# Patient Record
Sex: Female | Born: 1985 | Race: White | Hispanic: Yes | Smoking: Never smoker
Health system: Southern US, Community
[De-identification: ages and names within clinical notes are randomized; demographics above are authoritative.]

## PROBLEM LIST (undated history)

## (undated) DIAGNOSIS — J45909 Unspecified asthma, uncomplicated: Secondary | ICD-10-CM

## (undated) HISTORY — DX: Unspecified asthma, uncomplicated: J45.909

---

## 2008-06-19 ENCOUNTER — Ambulatory Visit: Payer: Self-pay | Admitting: Family Medicine

## 2008-06-19 ENCOUNTER — Inpatient Hospital Stay: Payer: Self-pay | Admitting: Obstetrics and Gynecology

## 2011-12-05 ENCOUNTER — Emergency Department: Payer: Self-pay | Admitting: *Deleted

## 2011-12-05 LAB — CBC
HCT: 41.5 % (ref 35.0–47.0)
HGB: 15 g/dL (ref 12.0–16.0)
MCHC: 36.2 g/dL — ABNORMAL HIGH (ref 32.0–36.0)
MCV: 87 fL (ref 80–100)
Platelet: 209 10*3/uL (ref 150–440)
RBC: 4.8 10*6/uL (ref 3.80–5.20)
WBC: 8.8 10*3/uL (ref 3.6–11.0)

## 2011-12-05 LAB — URINALYSIS, COMPLETE
Bacteria: NONE SEEN
Bilirubin,UR: NEGATIVE
Glucose,UR: NEGATIVE mg/dL (ref 0–75)
Nitrite: NEGATIVE
Specific Gravity: 1.023 (ref 1.003–1.030)
Squamous Epithelial: 3

## 2011-12-05 LAB — HCG, QUANTITATIVE, PREGNANCY: Beta Hcg, Quant.: 5573 m[IU]/mL — ABNORMAL HIGH

## 2011-12-07 ENCOUNTER — Emergency Department: Payer: Self-pay | Admitting: Emergency Medicine

## 2011-12-07 LAB — CBC
HCT: 40.5 % (ref 35.0–47.0)
HGB: 14.7 g/dL (ref 12.0–16.0)
MCH: 31.4 pg (ref 26.0–34.0)
MCV: 86 fL (ref 80–100)
Platelet: 202 10*3/uL (ref 150–440)
RBC: 4.69 10*6/uL (ref 3.80–5.20)
WBC: 9.6 10*3/uL (ref 3.6–11.0)

## 2011-12-07 LAB — HCG, QUANTITATIVE, PREGNANCY: Beta Hcg, Quant.: 4409 m[IU]/mL — ABNORMAL HIGH

## 2012-10-06 ENCOUNTER — Ambulatory Visit: Payer: Self-pay | Admitting: Family Medicine

## 2013-04-02 DIAGNOSIS — Z98891 History of uterine scar from previous surgery: Secondary | ICD-10-CM | POA: Insufficient documentation

## 2013-05-15 LAB — HM PAP SMEAR: HM Pap smear: NORMAL

## 2013-10-25 IMAGING — US US OB < 14 WEEKS - US OB TV
1 series · 14 of 28 positions shown · non-contrast
Comparison: none

REASON FOR EXAM: painless vaginal bleeding
7wks
COMMENTS:

[Series 1: us ob < 14 weeks - us ob tv · 0.23mm/px · 14 of 81 slices shown]
[im 3/81]
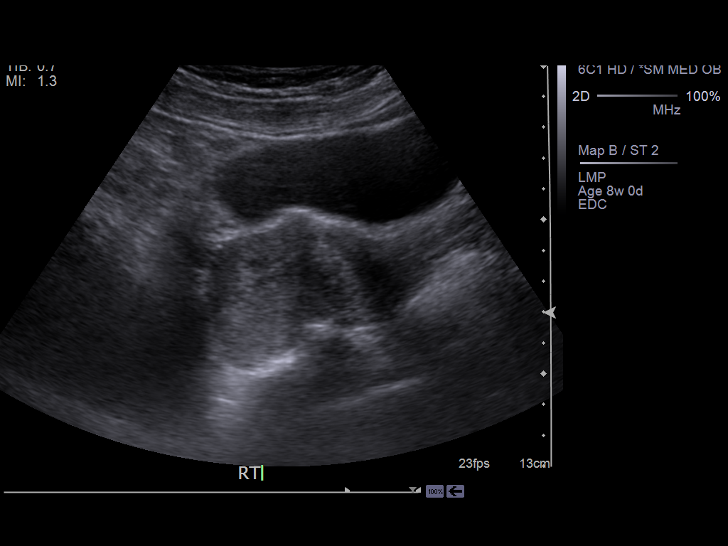
[im 9/81]
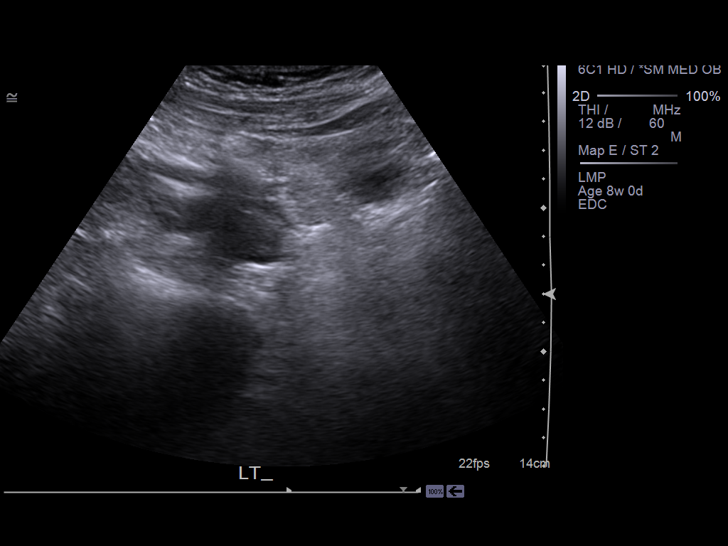
[im 15/81]
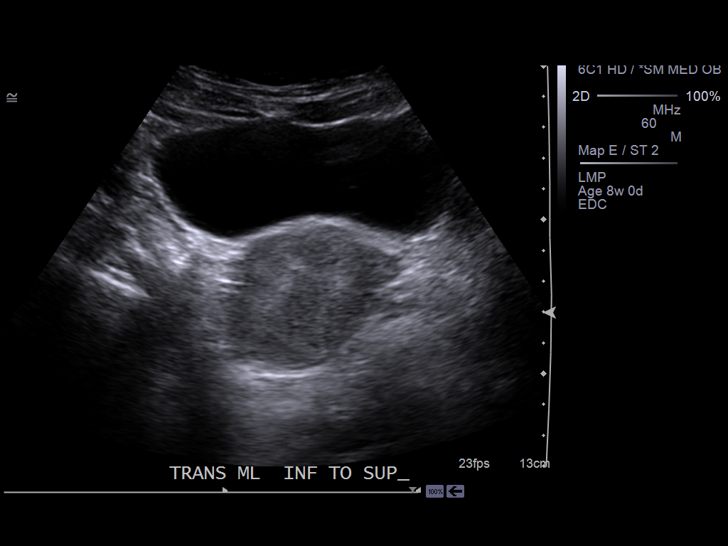
[im 21/81]
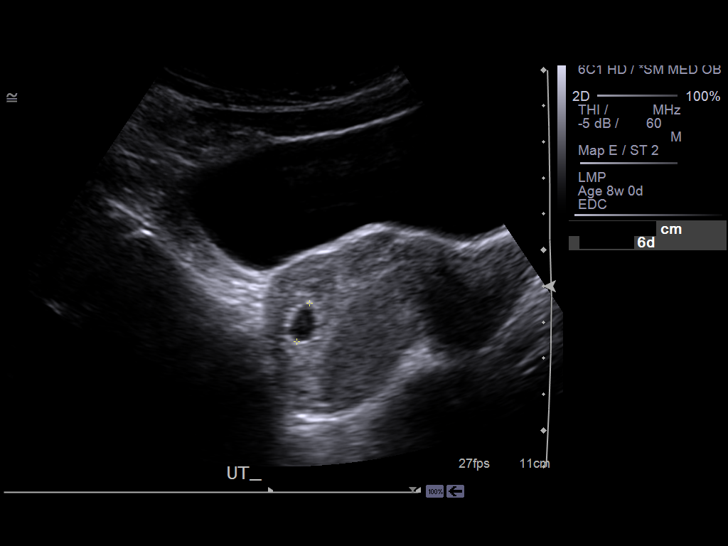
[im 27/81]
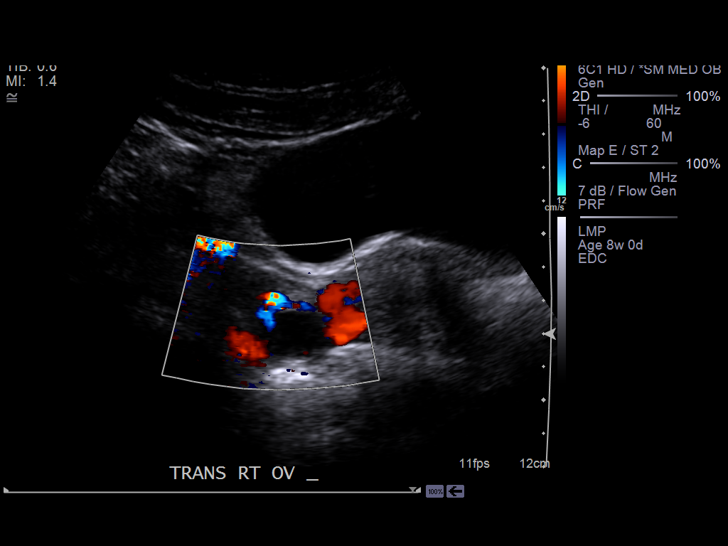
[im 33/81]
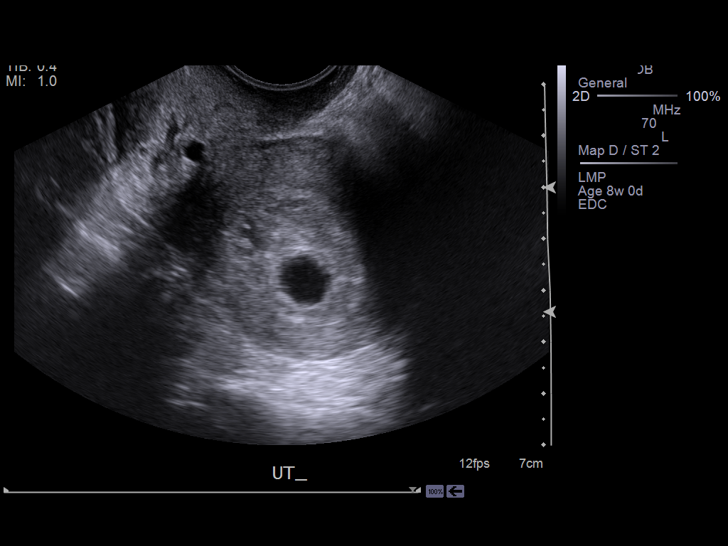
[im 39/81]
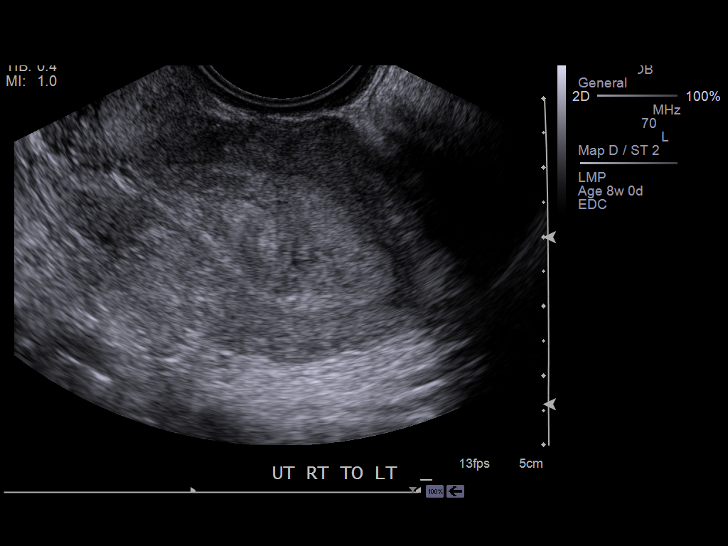
[im 45/81]
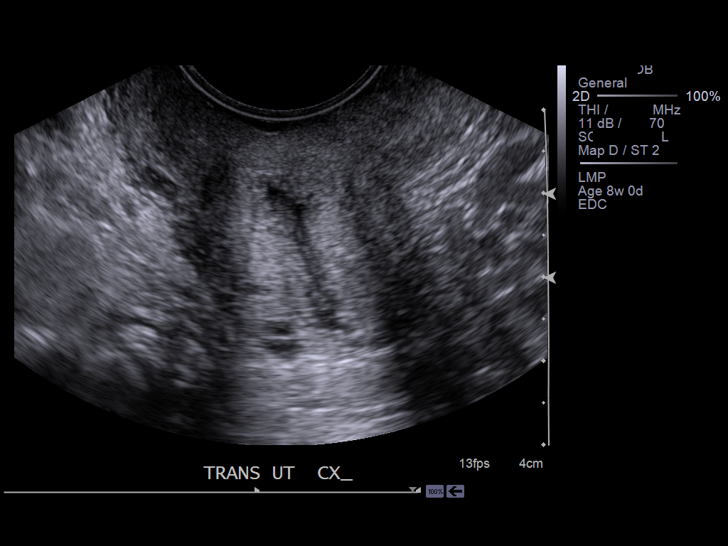
[im 51/81]
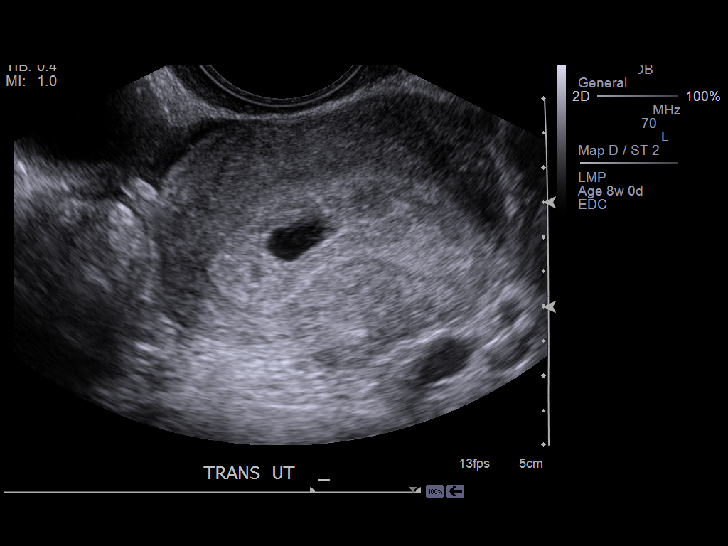
[im 57/81]
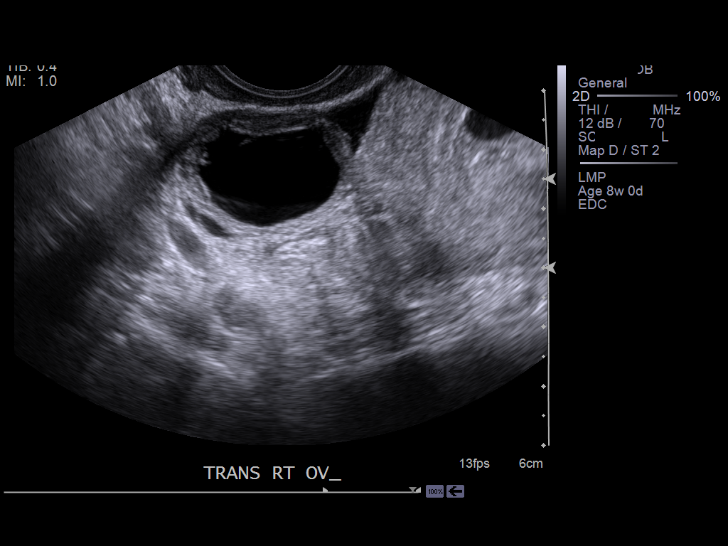
[im 63/81]
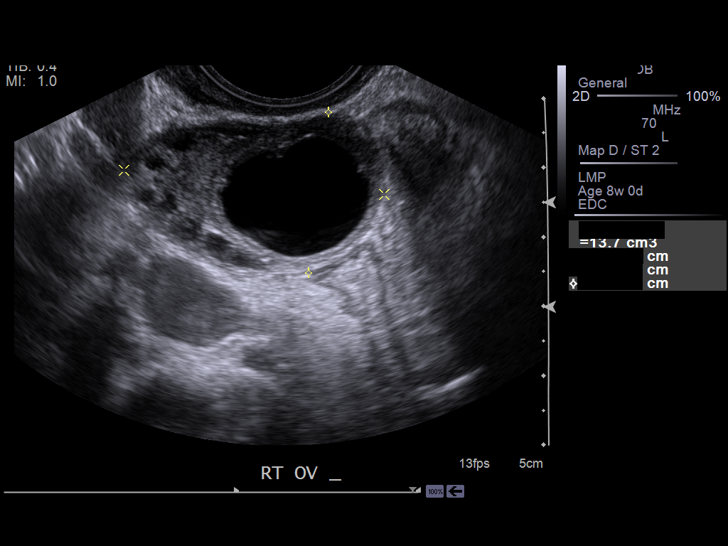
[im 69/81]
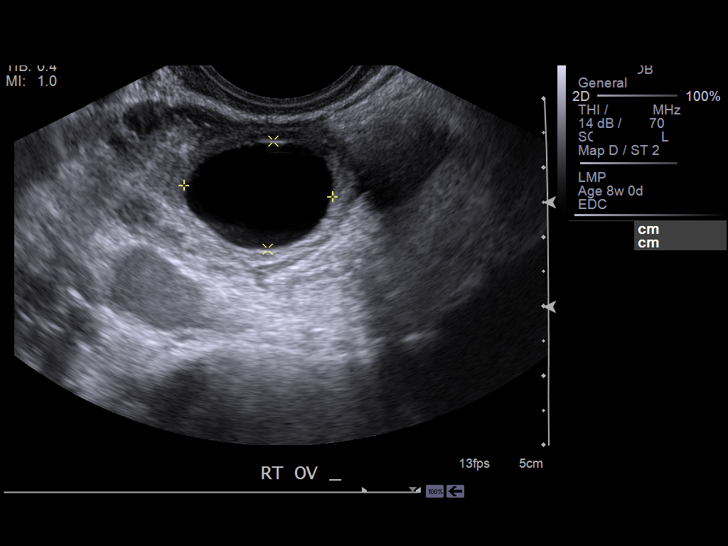
[im 75/81]
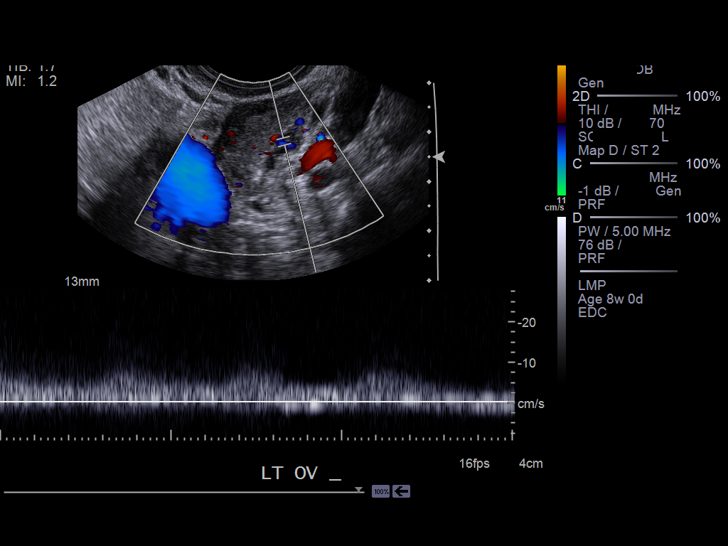
[im 81/81]
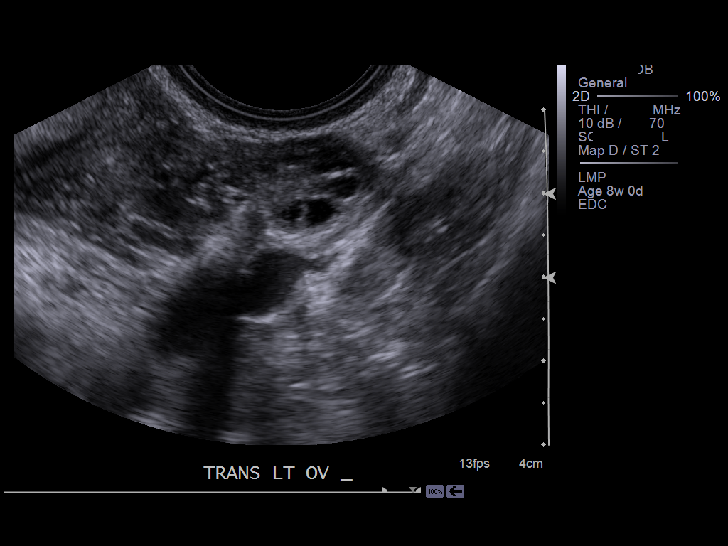

[14 of 28 positions shown; findings below may reference images not displayed]

PROCEDURE:     US  - US OB LESS THAN 14 WEEKS/W TRANS  - December 05, 2011 [DATE]

RESULT:     The patient is reportedly approximately 7 weeks pregnant and is
experiencing painless vaginal bleeding. Transabdominal and transvaginal
imaging was performed.

There is a fluid-filled sac in the uterus. No fetal pole is demonstrated.
The sac size is 1.16 cm corresponding to a 6 week 0 day gestation. No yolk
sac is visible.

The right ovary measures 3.8 x 3 x 2.3 cm. It contains a septated cyst
measuring 2.2 cm in greatest dimension. This structure does not appear
hypervascular as might be seen with an ectopic pregnancy. The left ovary
measures 2.8 x 1.9 x 1.6 cm and is normal in echotexture and vascularity.
IMPRESSION: 1. The uterus contains a fluid-filled presumed gestational sac with no
evidence of a fetal pole or yolk sac.
2. There is a septated cystic right ovarian structure that is not
hypervascular. The left ovary is normal in appearance.

Serial followup beta-hCG determinations and followup ultrasound examinations
would be useful.

[REDACTED]

## 2014-08-27 IMAGING — US US OB US >=[ID] SNGL FETUS
1 series · 13 of 28 positions shown · non-contrast
Comparison: none

REASON FOR EXAM: dates viability
COMMENTS:

PROCEDURE:     US  - US OB GREATER/OR EQUAL TO XADTO  - October 06, 2012  [DATE]
RESULT:
TECHNIQUE: Transabdominal imaging of the maternal pelvis was obtained in a
greater than 14 week gestation.

[Series 1: us ob us >=(id) sngl fetus · 0.21mm/px · 13 of 59 slices shown]
[im 3/59]
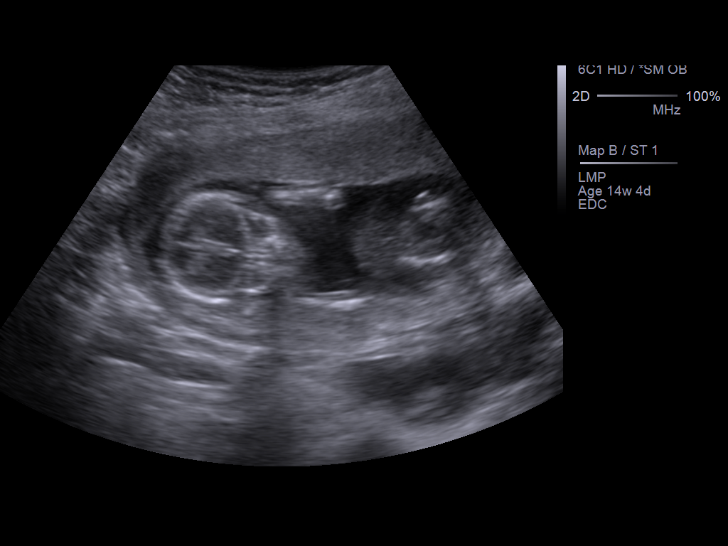
[im 7/59]
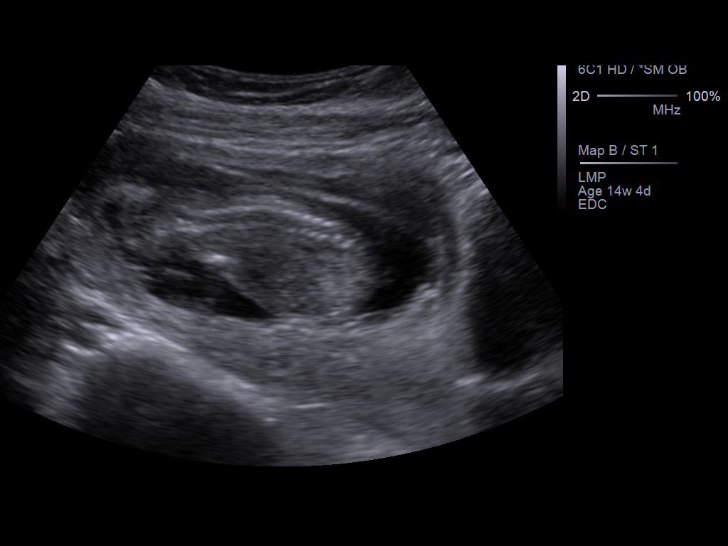
[im 11/59]
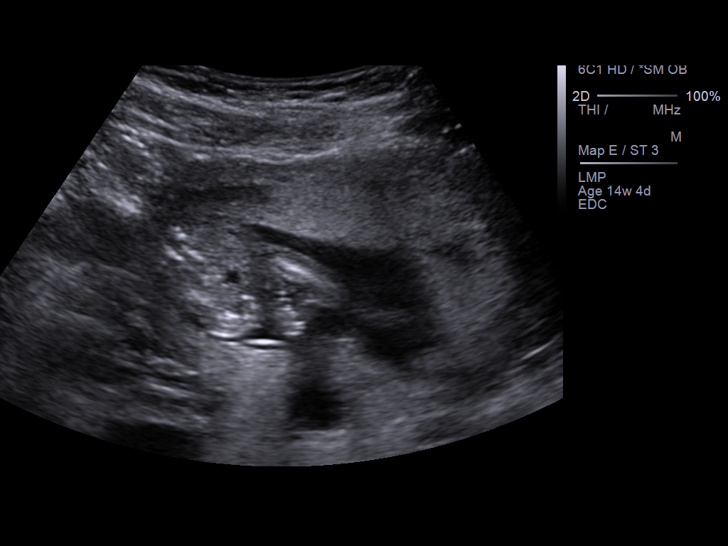
[im 16/59]
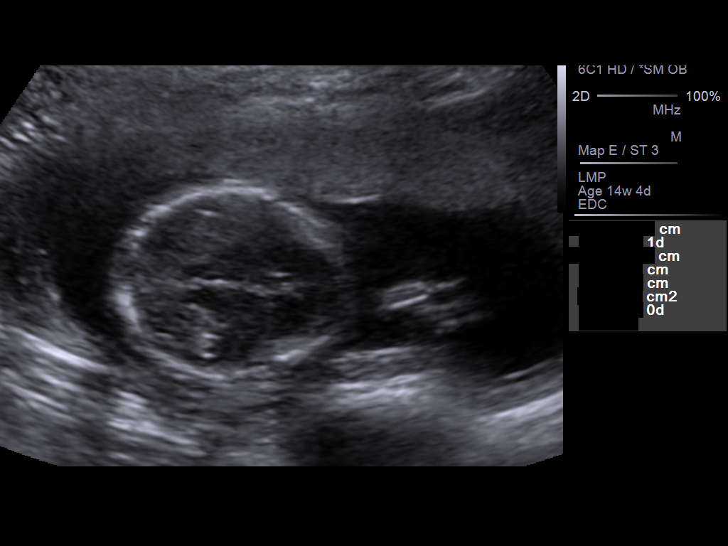
[im 20/59]
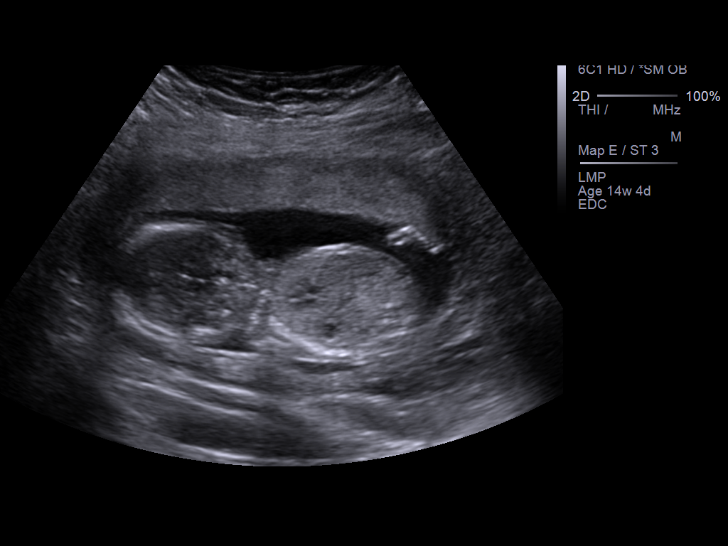
[im 24/59]
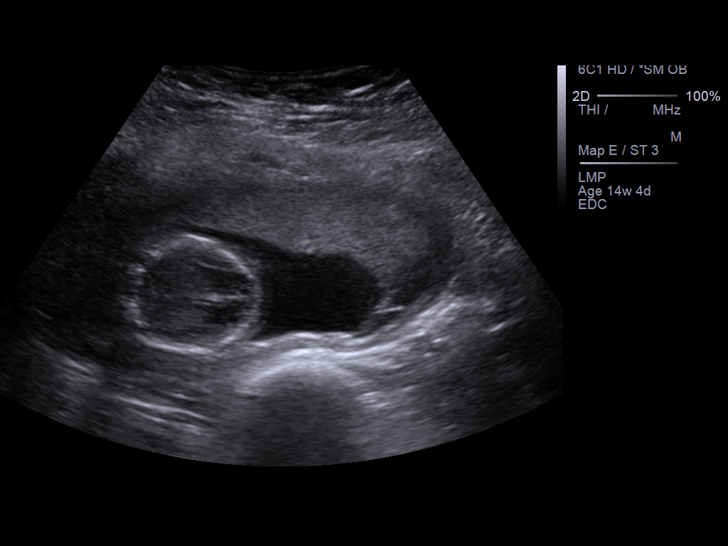
[im 31/59]
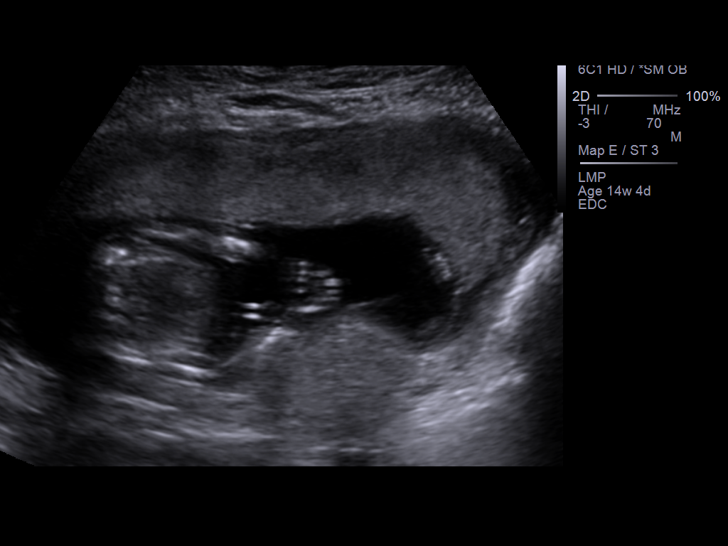
[im 35/59]
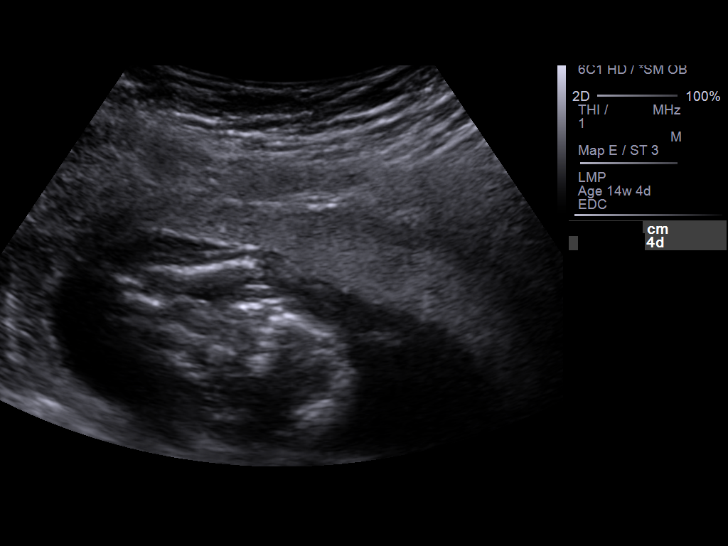
[im 39/59]
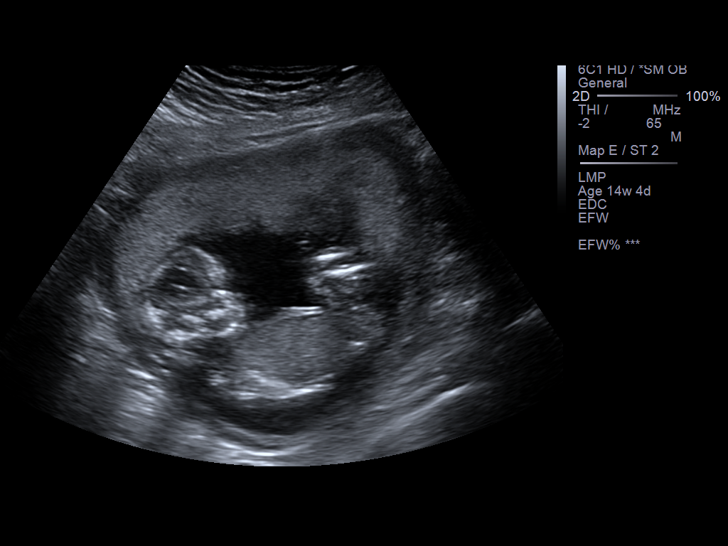
[im 43/59]
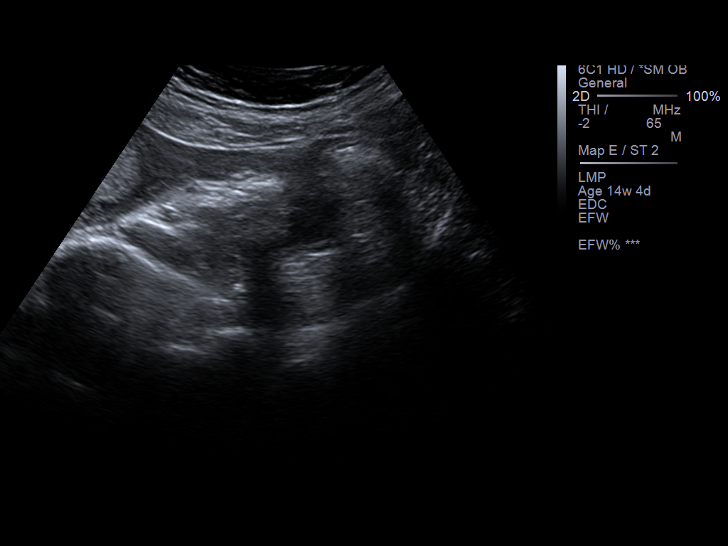
[im 48/59]
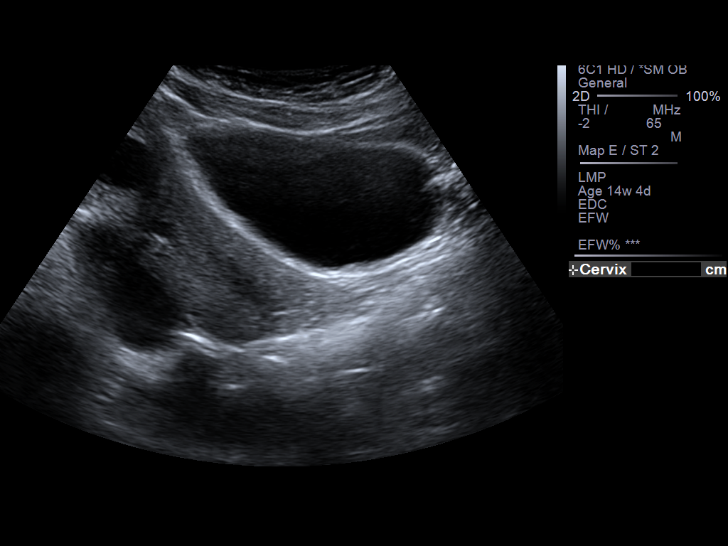
[im 52/59]
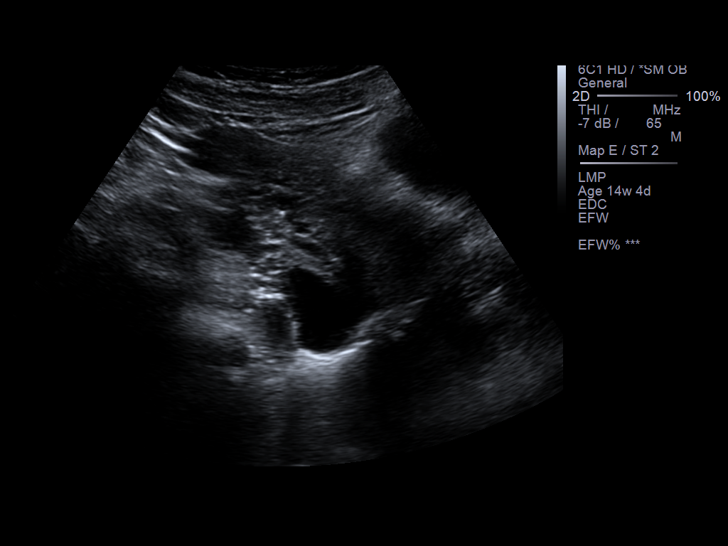
[im 56/59]
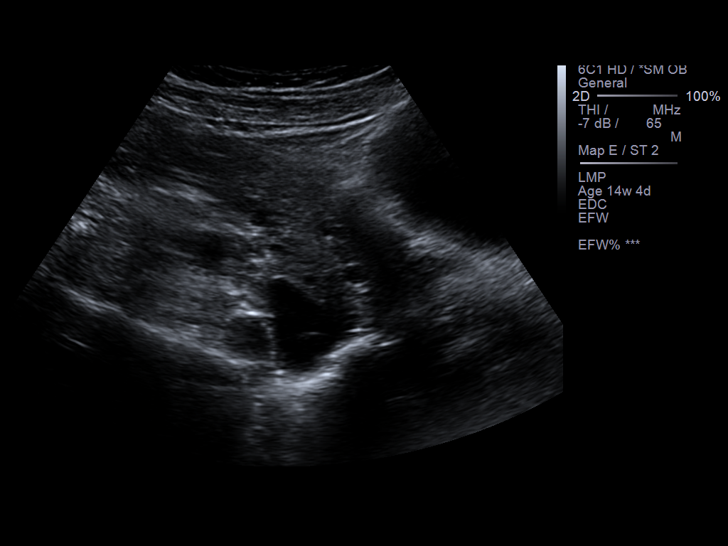

[13 of 28 positions shown; findings below may reference images not displayed]

FINDINGS: A single viable intrauterine pregnancy is identified. Cardiac,
trunk and extremity movement is identified. Fetal heart rate is 143 beats
per minute. Evaluation of fetal anatomy is limited due to early gestational
age. Bladder, renal, stomach, diaphragm, and ventricles are unremarkable.
The placenta is grade 0 in an anterolateral location with tip approximately
2.68 cm from a closed cervical os. Estimated fetal weight is 146 grams plus
or minus 22 grams.

Fetal biometry:

BPD: 3.24 cm EGA [REDACTED]

HC: 12 cm EGA [REDACTED] days

AC: 10.33 cm EGA [REDACTED] days

FL: 2 cm EGA [REDACTED] days

Amniotic fluid is visually unremarkable. A right ovarian cyst is identified
measuring 2.94 x 1.59 x 1.55 cm. There is otherwise no evidence of pelvic
masses, free fluid or loculated fluid collections. EDC per ultrasound is
03/23/2013.
IMPRESSION: Single viable intrauterine pregnancy with an estimated
gestational age of 14 weeks-3 days.

Thank you for the opportunity to contribute to the care of your patient.

Addendum: The cyst in the right adnexal region has a simple appearance, an
anechoic architecture with increased through-transmission and imperceptible
wall.

## 2016-02-15 LAB — HM HIV SCREENING LAB: HM HIV Screening: NEGATIVE

## 2019-10-14 ENCOUNTER — Other Ambulatory Visit: Payer: Self-pay

## 2019-10-14 ENCOUNTER — Ambulatory Visit: Payer: Self-pay | Admitting: Family Medicine

## 2019-10-14 ENCOUNTER — Encounter: Payer: Self-pay | Admitting: Family Medicine

## 2019-10-14 ENCOUNTER — Ambulatory Visit: Payer: Self-pay

## 2019-10-14 VITALS — BP 102/66 | Ht 64.0 in | Wt 182.2 lb

## 2019-10-14 DIAGNOSIS — Z3046 Encounter for surveillance of implantable subdermal contraceptive: Secondary | ICD-10-CM

## 2019-10-14 DIAGNOSIS — J45909 Unspecified asthma, uncomplicated: Secondary | ICD-10-CM | POA: Insufficient documentation

## 2019-10-14 DIAGNOSIS — Z1331 Encounter for screening for depression: Secondary | ICD-10-CM

## 2019-10-14 DIAGNOSIS — Z01419 Encounter for gynecological examination (general) (routine) without abnormal findings: Secondary | ICD-10-CM

## 2019-10-14 NOTE — Progress Notes (Signed)
Patient here for PE, pap test and nexplanon removal, desires pregnancy. Nexplanon was inserted 10/14/2016, last PE 02/15/2016, no paps on record in [facility] or Epic. Marland KitchenBurt Knack, RN

## 2019-10-14 NOTE — Progress Notes (Signed)
[facility] [address] [place], [place] [address] Main Number: [phone]  Family Planning Visit- Initial Visit  Subjective:  Kristi Kirk is a 34 y.o.  [id number]  being seen today for an initial well woman visit and to discuss family planning options. Patient reports they do want a pregnancy in the next year.   Chief Complaint  Patient presents with  . Annual Exam  . Contraception    nexplanon removal  . Gynecologic Exam    Pt has Asthma on their problem list.   HPI  Patient reports here for annual exam, nexplanon removal and pap. She is interested in becoming pregnant this year.    No LMP recorded (lmp unknown). Patient has had an implant. Last sex: 4 days ago BCM: Nexplanon Pt desires EC? N/a - desires pregnancy  Last pap per pt/review of record: 04/2013 = nml Last HIV test per pt/review of record: 01/2016 Last tetanus vaccine: 6 yrs ago w/pregnancy  Last breast exam: 1.5 yrs ago Personal/family hx breast cancer? no  Patient reports 1 partner(s) in last year. Do they desire STI screening (if no, why not)? No, declines  Does the patient desire a pregnancy in the next year? yes   34 y.o., Body mass index is 31.27 kg/m. - Is patient eligible for HA1C diabetes screening based on BMI and age >79?  no  HCV screening;       Has patient been screened once for HCV in the past?  yes  No results found for: HCVAB      Does the patient have current drug use, have a partner with drug use, and/or has been incarcerated since last result? no If yes-- Screen for HCV through [facility]   Does the patient meet criteria for HBV testing? no Criteria:  -Household, sexual or needle sharing contact with HBV -History of drug use -HIV positive -Those with known Hep C  PHQ-9 score is 8  See flowsheet for other program required questions.   Health Maintenance Due  Topic Date Due  . Hepatitis C Screening  Never done   . COVID-19 Vaccine (1) Never done  . TETANUS/TDAP  Never done  . PAP SMEAR-Modifier  05/15/2016  . INFLUENZA VACCINE  09/18/2019    ROS 10 point review of systems is otherwise negative except as mentioned in HPI and listed below: Breast mass/pain/secretion: states breasts have been sensitive since nexplanon insertion Nausea: yesterday was nauseous  The following portions of the patient's history were reviewed and updated as appropriate: allergies, current medications, past family history, past medical history, past social history, past surgical history and problem list. Problem list updated.   See flowsheet for other program required questions.  Objective:   Vitals:   10/14/19 1554  BP: 102/66  Weight: 182 lb 3.2 oz (82.6 kg)  Height: 5\' 4"  (1.626 m)    Physical Exam Vitals and nursing note reviewed.  Constitutional:      Appearance: Normal appearance.  HENT:     Head: Normocephalic and atraumatic.     Mouth/Throat:     Mouth: Mucous membranes are moist.     Pharynx: Oropharynx is clear. No oropharyngeal exudate or posterior oropharyngeal erythema.  Eyes:     Conjunctiva/sclera: Conjunctivae normal.  Neck:     Thyroid: No thyroid mass, thyromegaly or thyroid tenderness.  Cardiovascular:     Rate and Rhythm: Normal rate and regular rhythm.     Pulses: Normal pulses.  Heart sounds: Normal heart sounds.  Pulmonary:     Effort: Pulmonary effort is normal.     Breath sounds: Normal breath sounds.  Chest:     Breasts:        Right: Normal. No swelling, mass, nipple discharge, skin change or tenderness.        Left: Normal. No swelling, mass, nipple discharge, skin change or tenderness.  Abdominal:     General: Abdomen is flat.     Palpations: There is no mass.     Tenderness: There is no abdominal tenderness. There is no rebound.  Genitourinary:    General: Normal vulva.     Exam position: Lithotomy position.     Pubic Area: No rash or pubic lice.      Labia:         Right: No rash or lesion.        Left: No rash or lesion.      Vagina: Normal. No vaginal erythema, bleeding or lesions. Vaginal discharge: ph<4.5    Cervix: No cervical motion tenderness, discharge, friability, lesion or erythema.     Uterus: Normal.      Adnexa: Right adnexa normal and left adnexa normal.     Rectum: Normal.  Lymphadenopathy:     Head:     Right side of head: No preauricular or posterior auricular adenopathy.     Left side of head: No preauricular or posterior auricular adenopathy.     Cervical: No cervical adenopathy.     Upper Body:     Right upper body: No supraclavicular or axillary adenopathy.     Left upper body: No supraclavicular or axillary adenopathy.     Lower Body: No right inguinal adenopathy. No left inguinal adenopathy.  Skin:    General: Skin is warm and dry.     Findings: No rash.  Neurological:     Mental Status: She is alert and oriented to person, place, and time.    Nexplanon Removal Patient identified, informed consent performed, consent signed.   Appropriate time out taken. Nexplanon site identified.  Area prepped in usual sterile fashon. 3 ml of 1% lidocaine with Epinephrine was used to anesthetize the area at the distal end of the implant and along implant site. A small stab incision was made right beside the implant on the distal portion.  The Nexplanon rod was grasped using hemostats and removed without difficulty.  There was minimal blood loss. There were no complications.  Steri-strips were applied over the small incision.  A pressure bandage was applied to reduce any bruising.  The patient tolerated the procedure well and was given post procedure instructions.     Assessment and Plan:  Kristi Kirk is a 34 y.o. female presenting to the [facility] Department for an initial well woman exam/family planning visit.  Contraception counseling: Reviewed all forms of birth control options in the tiered based approach.  available including abstinence; over the counter/barrier methods; hormonal contraceptive medication including pill, patch, ring, injection,contraceptive implant, ECP; hormonal and nonhormonal IUDs; permanent sterilization options including vasectomy and the various tubal sterilization modalities. Risks, benefits, and typical effectiveness rates were reviewed.  Questions were answered.  Written information was also given to the patient to review.  Patient desires nothing - desires pregnancy. She will follow up in  1 year for surveillance.  She was told to call with any further questions, or with any concerns about this method of contraception.  Emphasized use of condoms 100% of  the time for STI prevention.  Emergency Contraception: n/a    1. Well woman exam -BCM: nexplanon removed today, pt desires pregnancy. Advised PNV, healthy lifestyle, avoid teratogens -Pap: done today -CBE: done today. "Active FYIs" info up to date. Recommended screening mammograms beginning at age 76 -STI screening: pt declines -PHQ-9 score 8. Referral to AM today.  -Hepatitis B/C screening: pt declines - IGP, Aptima HPV  2. Nexplanon removal See procedure note above  3. Uncomplicated asthma, unspecified asthma severity, unspecified whether persistent -Pt has asthma, endorses occ SOB since Covid infection a few months ago. Lungs clear and breathing normal in clinic today. Advised asap PCP f/u for this and handout of local providers given. Prescribed 1 time rx of albuterol to pt's pharmacy for interim and to go to ER if episode of SOB ever severe. - albuterol (VENTOLIN HFA) 108 (90 Base) MCG/ACT inhaler; Inhale 2 puffs into the lungs every 6 (six) hours as needed for wheezing or shortness of breath.  Dispense: 8 g; Refill: 0  4. Positive depression screening -Phq-9 score of 8 today, pt would like to speak with [facility], referral placed.  - Ambulatory referral to Behavioral Health    Return in about 1 year (around  10/13/2020) for yearly wellness exam.  No future appointments.  Ann Held, PA-C

## 2019-10-15 ENCOUNTER — Encounter: Payer: Self-pay | Admitting: Family Medicine

## 2019-10-15 MED ORDER — ALBUTEROL SULFATE HFA 108 (90 BASE) MCG/ACT IN AERS: 2.0000 | INHALATION_SPRAY | Freq: Four times a day (QID) | RESPIRATORY_TRACT | 0 refills | Status: AC | PRN

## 2019-10-19 LAB — IGP, APTIMA HPV
HPV Aptima: POSITIVE — AB
PAP Smear Comment: 0

## 2021-02-22 DIAGNOSIS — Z8759 Personal history of other complications of pregnancy, childbirth and the puerperium: Secondary | ICD-10-CM | POA: Insufficient documentation

## 2021-02-22 DIAGNOSIS — K219 Gastro-esophageal reflux disease without esophagitis: Secondary | ICD-10-CM | POA: Insufficient documentation

## 2022-01-06 ENCOUNTER — Ambulatory Visit (LOCAL_COMMUNITY_HEALTH_CENTER): Payer: Self-pay

## 2022-01-06 VITALS — BP 116/73 | Ht 64.0 in | Wt 188.0 lb

## 2022-01-06 DIAGNOSIS — Z3202 Encounter for pregnancy test, result negative: Secondary | ICD-10-CM

## 2022-01-06 LAB — PREGNANCY, URINE: Preg Test, Ur: NEGATIVE

## 2022-01-06 NOTE — Progress Notes (Signed)
UPT negative today. Kristi Kirk, interpreter today.    Per pt, one positive home UPT and three negative home UPT. Counseled regarding negative UPT results today.   Reports implant that was inserted while in hospital after birth of daughter 02/21/2021. No periods. Currently breastfeeding.   Pt reports hemorrhage at birth of daughter but no hx blood transfusion. Pt received [facility] at [facility], but did not have pp visit d/t no money. Reports tiredness.   RN counseled to have [facility] appt for PE. Pt in agreement. Kristi Kirk, interpreter walked pt to clerk to schedule. Jerel Shepherd, RN

## 2022-01-27 ENCOUNTER — Ambulatory Visit: Payer: Self-pay | Admitting: Family Medicine

## 2022-08-04 ENCOUNTER — Ambulatory Visit (LOCAL_COMMUNITY_HEALTH_CENTER): Payer: Self-pay | Admitting: Family Medicine

## 2022-08-04 ENCOUNTER — Encounter: Payer: Self-pay | Admitting: Family Medicine

## 2022-08-04 VITALS — BP 113/68 | HR 84 | Ht 65.0 in | Wt 187.8 lb

## 2022-08-04 DIAGNOSIS — Z01419 Encounter for gynecological examination (general) (routine) without abnormal findings: Secondary | ICD-10-CM

## 2022-08-04 DIAGNOSIS — E6609 Other obesity due to excess calories: Secondary | ICD-10-CM

## 2022-08-04 DIAGNOSIS — Z113 Encounter for screening for infections with a predominantly sexual mode of transmission: Secondary | ICD-10-CM

## 2022-08-04 DIAGNOSIS — Z8742 Personal history of other diseases of the female genital tract: Secondary | ICD-10-CM

## 2022-08-04 DIAGNOSIS — Z975 Presence of (intrauterine) contraceptive device: Secondary | ICD-10-CM | POA: Insufficient documentation

## 2022-08-04 DIAGNOSIS — Z3009 Encounter for other general counseling and advice on contraception: Secondary | ICD-10-CM

## 2022-08-04 LAB — WET PREP FOR TRICH, YEAST, CLUE
Trichomonas Exam: NEGATIVE
Yeast Exam: NEGATIVE

## 2022-08-04 LAB — HM HIV SCREENING LAB: HM HIV Screening: NEGATIVE

## 2022-08-04 NOTE — Progress Notes (Addendum)
Pt appointment for PE, Pap, STI. Seen by FNP Sydnee Levans. Family planning packet given and contents reviewed. Wet prep results all negative and reviewed with pt.

## 2022-08-04 NOTE — Progress Notes (Signed)
[facility] DEPARTMENT [facility] [address] Main Number: [phone]  Family Planning Visit- Repeat Yearly Visit  Subjective:  Kristi Kirk is a 37 y.o. [phone]  being seen today for an annual wellness visit and to discuss contraception options.   The patient is currently using Hormonal Implant for pregnancy prevention. Patient does not want a pregnancy in the next year.    report they are looking for a method that provides High efficacy at preventing pregnancy   Patient has the following medical problems: has Asthma; History of postpartum hemorrhage; History of C-section; GERD (gastroesophageal reflux disease); and Nexplanon in place on their problem list.  Chief Complaint  Patient presents with   Gynecologic Exam    PAP,   Contraception    PE, Has implant but last month had a lot of spotting.    Exposure to STD    routine screening    Patient reports to clinic for PE and pap smear with a generalized complaint of joint pain.   See flowsheet for other program required questions.   Body mass index is 31.25 kg/m. - Patient is eligible for diabetes screening based on BMI> 25 and age >35?  yes HA1C ordered? yes  Patient reports 1 of partners in last year. Desires STI screening?  Yes   Has patient been screened once for HCV in the past?  Yes  No results found for: "HCVAB"  Does the patient have current of drug use, have a partner with drug use, and/or has been incarcerated since last result? No  If yes-- Screen for HCV through [facility]   Does the patient meet criteria for HBV testing? No  Criteria:  -Household, sexual or needle sharing contact with HBV -History of drug use -HIV positive -Those with known Hep C   Health Maintenance Due  Topic Date Due   COVID-19 Vaccine (1) Never done   Hepatitis C Screening  Never done   DTaP/Tdap/Td (1 - Tdap) Never done   PAP SMEAR-Modifier  10/13/2020    Review of Systems   Constitutional:  Negative for weight loss.  Eyes:  Negative for blurred vision.  Respiratory:  Negative for cough and shortness of breath.   Cardiovascular:  Negative for claudication.  Gastrointestinal:  Negative for nausea.  Genitourinary:  Negative for dysuria and frequency.  Skin:  Negative for rash.  Neurological:  Negative for headaches.  Endo/Heme/Allergies:  Does not bruise/bleed easily.    The following portions of the patient's history were reviewed and updated as appropriate: allergies, current medications, past family history, past medical history, past social history, past surgical history and problem list. Problem list updated.  Objective:   Vitals:   08/04/22 0953  BP: 113/68  Pulse: 84  Weight: 187 lb 12.8 oz (85.2 kg)  Height: 5\' 5"  (1.651 m)    Physical Exam Vitals and nursing note reviewed.  Constitutional:      Appearance: She is obese.  HENT:     Head: Normocephalic and atraumatic.     Mouth/Throat:     Mouth: Mucous membranes are moist.     Pharynx: Oropharynx is clear. No oropharyngeal exudate or posterior oropharyngeal erythema.  Pulmonary:     Effort: Pulmonary effort is normal.  Chest:  Breasts:    Tanner Score is 5.     Right: Normal. No mass, nipple discharge, skin change or tenderness.     Left: No mass, nipple discharge, skin change or tenderness.  Abdominal:  General: Abdomen is flat.     Palpations: There is no mass.     Tenderness: There is no abdominal tenderness. There is no rebound.  Genitourinary:    General: Normal vulva.     Exam position: Lithotomy position.     Pubic Area: No rash or pubic lice.      Labia:        Right: No rash or lesion.        Left: No rash or lesion.      Vagina: Normal. No vaginal discharge, erythema, bleeding or lesions.     Cervix: No cervical motion tenderness, discharge, friability, lesion or erythema.     Uterus: Normal.      Adnexa: Right adnexa normal and left adnexa normal.     Rectum:  Normal.     Comments: pH = 4 Lymphadenopathy:     Head:     Right side of head: No preauricular or posterior auricular adenopathy.     Left side of head: No preauricular or posterior auricular adenopathy.     Cervical: No cervical adenopathy.     Upper Body:     Right upper body: No supraclavicular, axillary or epitrochlear adenopathy.     Left upper body: No supraclavicular, axillary or epitrochlear adenopathy.     Lower Body: No right inguinal adenopathy. No left inguinal adenopathy.  Skin:    General: Skin is warm and dry.     Findings: No rash.  Neurological:     Mental Status: She is alert and oriented to person, place, and time.       Assessment and Plan:  Kristi Kirk is a 37 y.o. female [phone] presenting to the [facility] Department for an yearly wellness and contraception visit  1. Well woman exam with routine gynecological exam -no concerns other than joint pain -this started about 6 months ago- reports her fingers and knees hurt and her right arm gets numb when she is sleeping -reports the nexplanon is on the left arm and she states she doesn't think the joint pain is related to the nexplanon because she has had 2 previous Nexplanons -discussed that while not common- joint pain could be a SE of nexplanon -I encouraged exercise, anti-inflammatory foods/supplements, ibuprofen with more bothersome pain -pt states she has a PCP who told her this was normal -counseled that she can try some of these suggestions and with continued discomfort- we can discuss removal of implant is she desires -CBE today normal -counseled on mammograms starting at 40  - IGP, Aptima HPV - Hgb A1c w/o eAG  2. Family planning Contraception counseling: Reviewed options based on patient desire and reproductive life plan. Patient is interested in Hormonal Implant. This was not provided to the patient today. Implant in place  Risks, benefits, and typical effectiveness rates were  reviewed.  Questions were answered.  Written information was also given to the patient to review.    The patient will follow up in  1 years for surveillance.  The patient was told to call with any further questions, or with any concerns about this method of contraception.  Emphasized use of condoms 100% of the time for STI prevention.  Educated on ECP and assessed need for ECP. Not indicated- implant in place  3. History of abnormal cervical Pap smear -last pap NILM but HPV positive -repeat today  - IGP, Aptima HPV  4. Screening for venereal disease -no concerns but would like testing today  - Chlamydia/Gonorrhea  [facility] - HIV [place] [facility] - Syphilis Serology, [facility] - WET PREP FOR TRICH, YEAST, CLUE  5. Class 1 obesity due to excess calories without serious comorbidity with body mass index (BMI) of 31.0 to 31.9 in adult -encouraged exercise -A1c test today  6. Nexplanon in place Placed 02/23/21, good until 02/23/25    No follow-ups on file.  No future appointments. Due to language barrier, interpreter Estill Dooms was present for this visit.  Lenice Llamas, [place]

## 2022-08-05 LAB — HGB A1C W/O EAG: Hgb A1c MFr Bld: 5.2 % (ref 4.8–5.6)

## 2022-08-08 ENCOUNTER — Encounter: Payer: Self-pay | Admitting: Family Medicine

## 2022-08-08 DIAGNOSIS — Z8742 Personal history of other diseases of the female genital tract: Secondary | ICD-10-CM | POA: Insufficient documentation

## 2022-08-08 LAB — IGP, APTIMA HPV
HPV Aptima: NEGATIVE
PAP Smear Comment: 0
